# Patient Record
Sex: Female | Born: 1990 | Race: Black or African American | Hispanic: No | Marital: Single | Smoking: Current every day smoker
Health system: Southern US, Community
[De-identification: ages and names within clinical notes are randomized; demographics above are authoritative.]

## PROBLEM LIST (undated history)

## (undated) DIAGNOSIS — O8823 Thromboembolism in the puerperium: Secondary | ICD-10-CM

## (undated) HISTORY — PX: NO PAST SURGERIES: SHX2092

---
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
## 2010-10-30 LAB — URINALYSIS, ROUTINE W REFLEX MICROSCOPIC
Bilirubin Urine: NEGATIVE
Glucose, UA: NEGATIVE mg/dL
Hgb urine dipstick: NEGATIVE
Ketones, ur: NEGATIVE mg/dL
Protein, ur: NEGATIVE mg/dL
pH: 6 (ref 5.0–8.0)

## 2010-12-03 NOTE — Discharge Summary (Signed)
NAME:  Alicia Gutierrez, Alicia Gutierrez NO.:  [id number]   MEDICAL RECORD NO.:  [id number]          PATIENT TYPE:  INP   LOCATION:  9318                          FACILITY:  [facility]   PHYSICIAN:  Tracy L. Mayford Knife, M.D.DATE OF BIRTH:  1991-01-12   DATE OF ADMISSION:  12/21/2006  DATE OF DISCHARGE:  12/27/2006                               DISCHARGE SUMMARY   DISCHARGE DIAGNOSES:  1. Postpartum right adnexal phlegmon.  2. Right pelvic vein thrombophlebitis.  3. Normocytic anemia.  4. Status post normal spontaneous vaginal delivery Dec 18, 2006.   CONSULTATIONS:  Cliffton Asters, M.D., infectious disease.   LABORATORY DATA:  ANA negative.  Lupus anticoagulant negative.  Admission WBC 12.9.  Latest white blood cell count 11.6.  Platelets  219,000.  Blood cultures no growth to date.   IMAGING:  Chest x-ray within normal limits.  MRI of the pelvis initially  read as right gonadal vein thrombophlebitis.  Followup review by Dr.  Jena Gauss showed right parametrial/adnexal phlegmon.   HOSPITAL COURSE:  The patient is a 20 year old, status post  uncomplicated normal spontaneous vaginal delivery on May 30.  The  patient was discharged on June 1 but by late that afternoon she  developed a fever and severe right lower quadrant pain.  She presented  to the MAU and was readmitted on December 21, 2006.  CT scan showed the  expectant post delivery changes with right adnexal inflammation in a  thrombosis of the right gonadal vein.  The patient was started on  clindamycin, gentamicin and Lovenox.  Her pain improved rapidly but she  continued to spike fevers.  The MRI was reviewed again and this time  right parametrial phlegmon with no drainable abscess was noted.  Antibiotic therapy was broadened to Primaxin.  Infectious disease was  consulted and they agreed with the change in the antibiotics.   Please note that the patient was started on Lovenox on admission.  However, it was discontinued when she was  changed to Primaxin.  According to an up to date review, there does __________  anticoagulation in the patient with septic pelvic vein thrombophlebitis.  Expert recommendations were based on small retrospective studies rather  than large randomized controlled trials.  Based on that, Dr. Orvan Falconer  recommended just doing to Primaxin and if she improved, this might be  sufficient.   At discharge, the patient had been afebrile since 10 p.m. on June 6.   DISPOSITION:  Home.  Followup in one week at the GYN clinic.  Messages  left for Tresa Endo __________  to call the patient back with an appointment.   DISCHARGE MEDICATIONS:  1. Augmentin 500 one tablet p.o. b.i.d. for 10 days  2. Prenatal vitamins daily.   DISCHARGE INSTRUCTIONS:  1. The patient is to return earlier if she starts spiking temperatures      greater than 100.5 or has increased pain or vaginal bleeding.  2. Diet:  Regular.  3. Nothing per vagina for six weeks.  4. No heavy lifting.           ______________________________  Marc Morgans. Mayford Knife, M.D.  TLW/MEDQ  D:  12/27/2006  T:  12/28/2006  Job:  [id number]

## 2011-04-18 LAB — URINE CULTURE: Colony Count: 5000

## 2011-04-18 LAB — PREGNANCY, URINE: Preg Test, Ur: NEGATIVE

## 2011-04-18 LAB — URINE MICROSCOPIC-ADD ON

## 2011-04-18 LAB — URINALYSIS, ROUTINE W REFLEX MICROSCOPIC
Bilirubin Urine: NEGATIVE
Glucose, UA: NEGATIVE
Hgb urine dipstick: NEGATIVE
Ketones, ur: NEGATIVE
Nitrite: NEGATIVE
Protein, ur: NEGATIVE
Specific Gravity, Urine: 1.024
Urobilinogen, UA: 1
pH: 5.5

## 2011-04-18 LAB — WET PREP, GENITAL
Trich, Wet Prep: NONE SEEN
Yeast Wet Prep HPF POC: NONE SEEN

## 2011-04-18 LAB — GC/CHLAMYDIA PROBE AMP, GENITAL
Chlamydia, DNA Probe: POSITIVE — AB
GC Probe Amp, Genital: POSITIVE — AB

## 2011-04-25 LAB — WET PREP, GENITAL
Trich, Wet Prep: NONE SEEN
Yeast Wet Prep HPF POC: NONE SEEN

## 2011-04-25 LAB — GC/CHLAMYDIA PROBE AMP, GENITAL: GC Probe Amp, Genital: NEGATIVE

## 2011-04-25 LAB — URINALYSIS, ROUTINE W REFLEX MICROSCOPIC
Bilirubin Urine: NEGATIVE
Hgb urine dipstick: NEGATIVE
Protein, ur: NEGATIVE mg/dL
Urobilinogen, UA: 0.2 mg/dL (ref 0.0–1.0)

## 2011-05-08 LAB — LUPUS ANTICOAGULANT PANEL
DRVVT: 67.6 — ABNORMAL HIGH (ref 36.1–47.0)
Lupus Anticoagulant: NOT DETECTED
PTTLA 4:1 Mix: 71 — ABNORMAL HIGH (ref 36.3–48.8)

## 2011-05-08 LAB — BASIC METABOLIC PANEL
Calcium: 8.6
Chloride: 108
Creatinine, Ser: 0.66
Potassium: 3.4 — ABNORMAL LOW
Sodium: 137

## 2011-05-08 LAB — CBC
HCT: 32.5 — ABNORMAL LOW
HCT: 33
Hemoglobin: 10.7 — ABNORMAL LOW
Hemoglobin: 10.9 — ABNORMAL LOW
MCHC: 33.7
MCHC: 33.9
MCV: 83.5
MCV: 83.5
MCV: 84.3
MCV: 85.7
Platelets: 219
Platelets: 239
RBC: 3.8
RBC: 3.83
RDW: 13.5
RDW: 13.8 — ABNORMAL HIGH
RDW: 14.2 — ABNORMAL HIGH
WBC: 10.2
WBC: 11.6
WBC: 15.2 — ABNORMAL HIGH

## 2011-05-08 LAB — URINALYSIS, ROUTINE W REFLEX MICROSCOPIC
Bilirubin Urine: NEGATIVE
Hgb urine dipstick: NEGATIVE
Ketones, ur: NEGATIVE
Protein, ur: NEGATIVE
Urobilinogen, UA: 2 — ABNORMAL HIGH

## 2011-05-08 LAB — APTT
aPTT: 31
aPTT: 35

## 2011-05-08 LAB — GENTAMICIN LEVEL, TROUGH: Gentamicin Trough: 0.9

## 2011-05-08 LAB — CULTURE, BLOOD (ROUTINE X 2)

## 2011-05-08 LAB — DIFFERENTIAL
Basophils Absolute: 0
Eosinophils Relative: 1
Lymphocytes Relative: 10 — ABNORMAL LOW
Lymphocytes Relative: 8 — ABNORMAL LOW
Lymphs Abs: 1.1 — ABNORMAL LOW
Monocytes Absolute: 1.1
Monocytes Absolute: 2 — ABNORMAL HIGH
Monocytes Relative: 16 — ABNORMAL HIGH
Neutro Abs: 10.6 — ABNORMAL HIGH
Neutro Abs: 9.1 — ABNORMAL HIGH

## 2011-05-08 LAB — GRAM STAIN

## 2011-05-08 LAB — URINE CULTURE
Colony Count: NO GROWTH
Special Requests: NEGATIVE

## 2011-05-08 LAB — ANA: Anti Nuclear Antibody(ANA): NEGATIVE

## 2011-05-08 LAB — GENTAMICIN LEVEL, PEAK: Gentamicin Pk: 5.7

## 2012-04-15 ENCOUNTER — Emergency Department (HOSPITAL_COMMUNITY)
Admission: EM | Admit: 2012-04-15 | Discharge: 2012-04-15 | Disposition: A | Discharge status: Home / Self Care | Payer: Medicaid Other | Attending: Emergency Medicine | Admitting: Emergency Medicine

## 2012-04-15 ENCOUNTER — Encounter (HOSPITAL_COMMUNITY): Payer: Self-pay | Admitting: Emergency Medicine

## 2012-04-15 DIAGNOSIS — H00019 Hordeolum externum unspecified eye, unspecified eyelid: Secondary | ICD-10-CM | POA: Insufficient documentation

## 2012-04-15 DIAGNOSIS — H00011 Hordeolum externum right upper eyelid: Secondary | ICD-10-CM

## 2012-04-15 DIAGNOSIS — F17200 Nicotine dependence, unspecified, uncomplicated: Secondary | ICD-10-CM | POA: Insufficient documentation

## 2012-04-15 MED ORDER — CEPHALEXIN 500 MG PO CAPS: 500.0000 mg | ORAL_CAPSULE | Freq: Three times a day (TID) | ORAL | Status: DC

## 2012-04-15 MED ORDER — OXYCODONE-ACETAMINOPHEN 5-325 MG PO TABS: 2.0000 | ORAL_TABLET | Freq: Once | ORAL | Status: DC

## 2012-04-15 NOTE — ED Notes (Signed)
Pt presenting to ed with c/o right eye with swelling and pain onset this morning pt denies any drainage to her eye. Pt denies blurred vision.

## 2012-04-15 NOTE — ED Provider Notes (Signed)
History     CSN: [id number]  Arrival date & time 04/15/12  4098   First MD Initiated Contact with Patient 04/15/12 [phone]      Chief Complaint  Patient presents with  . Eye Pain    (Consider location/radiation/quality/duration/timing/severity/associated sxs/prior treatment) HPI Comments: Alicia Gutierrez 21 y.o. female   The chief complaint is: Patient presents with:   Eye Pain   The patient has medical history significant for:   History reviewed. No pertinent past medical history.  Patient presents with right eye swelling that began this morning. She states that it is mildly uncomfortable. Patient also reports more sneezing recently and she may be developing allergies. Of note patient has false eyelashes that she has been wearing for 2 weeks, but does not think that this is contributing to her current chief complaint. Denies fever or chills. Denies visual disturbance. Denies eye trauma. Denies use of contact lenses.       The history is provided by the patient. No language interpreter was used.    History reviewed. No pertinent past medical history.  History reviewed. No pertinent past surgical history.  No family history on file.  History  Substance Use Topics  . Smoking status: Current Every Day Smoker    Types: Cigarettes  . Smokeless tobacco: Not on file  . Alcohol Use: No    OB History    Grav Para Term Preterm Abortions TAB SAB Ect Mult Living                  Review of Systems  Constitutional: Negative for fever and chills.  HENT: Positive for sneezing.   Eyes: Positive for pain. Negative for discharge, redness, itching and visual disturbance.  All other systems reviewed and are negative.    Allergies  Review of patient's allergies indicates no known allergies.  Home Medications  No current outpatient prescriptions on file.  BP 130/76  Pulse 82  Temp 98.8 F (37.1 C) (Oral)  Resp 20  SpO2 99%  LMP 03/20/2012  Physical Exam  Nursing  note and vitals reviewed. Constitutional: She appears well-developed and well-nourished. No distress.  HENT:  Head: Normocephalic and atraumatic.  Mouth/Throat: Oropharynx is clear and moist.  Eyes: Conjunctivae normal and EOM are normal. Pupils are equal, round, and reactive to light. No scleral icterus.       Patient has mild right upper eyelid swelling with one area midline that seem slightly more pronounce, consistent with a hordeolum.  Neck: Normal range of motion. Neck supple.  Cardiovascular: Normal rate, regular rhythm and normal heart sounds.   Pulmonary/Chest: Effort normal and breath sounds normal.  Abdominal: Soft. Bowel sounds are normal. There is no tenderness.  Musculoskeletal: Normal range of motion.  Neurological: She is alert.  Skin: Skin is warm and dry.    ED Course  Procedures (including critical care time)  Labs Reviewed - No data to display No results found.   1. Hordeolum externum of right upper eyelid       MDM  Patient presented with acute right upper eyelid swelling she noticed this morning. Physical exam consistent with hordeolum. Patient instructed to remove false eye lashes, use warm compresses, and discharged on keflex with return precautions. No red flags for foreign body, corneal abrasion, or periorbital cellulitis.        Pixie Casino, PA-C 04/15/12 1018

## 2012-04-15 NOTE — ED Notes (Signed)
Pt c/o right eye pain, redness, and eyelid swelling since yesterday.  Pt denies known injury or foreign object.  Pt does not wear contacts.  Pt with false eyelashes which were applied 2 weeks ago, pt states she has worn false eyelashes for several years.

## 2012-04-15 NOTE — ED Provider Notes (Signed)
Medical screening examination/treatment/procedure(s) were performed by non-physician practitioner and as supervising physician I was immediately available for consultation/collaboration.   Jeremaih Klima M Arnelle Nale, MD 04/15/12 1521 

## 2012-10-14 ENCOUNTER — Encounter (HOSPITAL_COMMUNITY): Payer: Self-pay | Admitting: *Deleted

## 2012-10-14 ENCOUNTER — Inpatient Hospital Stay (HOSPITAL_COMMUNITY)
Admission: AD | Admit: 2012-10-14 | Discharge: 2012-10-14 | Disposition: A | Discharge status: Home / Self Care | Payer: Self-pay | Source: Ambulatory Visit | Attending: Obstetrics and Gynecology | Admitting: Obstetrics and Gynecology

## 2012-10-14 DIAGNOSIS — N938 Other specified abnormal uterine and vaginal bleeding: Secondary | ICD-10-CM | POA: Insufficient documentation

## 2012-10-14 DIAGNOSIS — N898 Other specified noninflammatory disorders of vagina: Secondary | ICD-10-CM

## 2012-10-14 DIAGNOSIS — N939 Abnormal uterine and vaginal bleeding, unspecified: Secondary | ICD-10-CM

## 2012-10-14 DIAGNOSIS — R109 Unspecified abdominal pain: Secondary | ICD-10-CM | POA: Insufficient documentation

## 2012-10-14 DIAGNOSIS — N949 Unspecified condition associated with female genital organs and menstrual cycle: Secondary | ICD-10-CM | POA: Insufficient documentation

## 2012-10-14 DIAGNOSIS — B9689 Other specified bacterial agents as the cause of diseases classified elsewhere: Secondary | ICD-10-CM | POA: Insufficient documentation

## 2012-10-14 DIAGNOSIS — N760 Acute vaginitis: Secondary | ICD-10-CM | POA: Insufficient documentation

## 2012-10-14 DIAGNOSIS — A499 Bacterial infection, unspecified: Secondary | ICD-10-CM | POA: Insufficient documentation

## 2012-10-14 HISTORY — DX: Thromboembolism in the puerperium: O88.23

## 2012-10-14 LAB — WET PREP, GENITAL
Trich, Wet Prep: NONE SEEN
Yeast Wet Prep HPF POC: NONE SEEN

## 2012-10-14 LAB — URINE MICROSCOPIC-ADD ON

## 2012-10-14 LAB — CBC WITH DIFFERENTIAL/PLATELET
Basophils Relative: 0 % (ref 0–1)
Eosinophils Absolute: 0.1 10*3/uL (ref 0.0–0.7)
Hemoglobin: 13.5 g/dL (ref 12.0–15.0)
MCH: 29.2 pg (ref 26.0–34.0)
MCHC: 33.8 g/dL (ref 30.0–36.0)
Monocytes Absolute: 0.4 10*3/uL (ref 0.1–1.0)
Monocytes Relative: 7 % (ref 3–12)
Neutrophils Relative %: 52 % (ref 43–77)

## 2012-10-14 LAB — URINALYSIS, ROUTINE W REFLEX MICROSCOPIC
Bilirubin Urine: NEGATIVE
Ketones, ur: NEGATIVE mg/dL
Nitrite: NEGATIVE
Urobilinogen, UA: 0.2 mg/dL (ref 0.0–1.0)

## 2012-10-14 MED ORDER — METRONIDAZOLE 500 MG PO TABS: 500.0000 mg | ORAL_TABLET | Freq: Two times a day (BID) | ORAL | Status: DC

## 2012-10-14 NOTE — MAU Note (Signed)
Patient states she has an Implanon in for 6 years. States she started bleeding 3-13 and has bled every day since, some heavy some light. Today spotting. Has been having abdominal pain for about one week. States 3-13 period was late.

## 2012-10-14 NOTE — MAU Provider Note (Signed)
History     CSN: [id number]  Arrival date and time: 10/14/12 1140   First Provider Initiated Contact with Patient 10/14/12 1223      Chief Complaint  Patient presents with  . Vaginal Bleeding  . Abdominal Pain   HPI Pt is a G2P2 who presents for prolonged menstrual bleeding. Onset was 2 weeks ago. For the first 4 days she bled very heavily, more so than a normal period, soaking through her clothes. Since then she has been bleeding as she does during a normal period, about 5 pads a day, and it does not seem to be decreasing. Associated symptoms include lower abdominal cramping that feels like menstrual cramps. She is sexually active, last time about 2 weeks ago. STD history includes trichomonas. She has not experienced any other bleeding such as gums or nose; no easy bruising. She has no history of thyroid disease, nor do any family members. She has not experienced any abdominal or vaginal trauma. No recent illnesses. No dysuria, hematuria. No fever/chills. No nausea/vomiting. Bowel habits normal.   Contraception: Implanon implanted about 3-4 years Menarche: age 21 Period frequency: once per month every three months and then skip month while with Implanon Duration: 5-7 days Amount: 5 pads per day when normal  Past Medical History  Diagnosis Date  . Obstetrical blood-clot embolism, with postpartum complication 2008    blood clot in pelvis after delivery    Past Surgical History  Procedure Laterality Date  . No past surgeries      Family History  Problem Relation Age of Onset  . Hypertension Mother   . Hypertension Maternal Grandmother     History  Substance Use Topics  . Smoking status: Current Every Day Smoker    Types: Cigarettes  . Smokeless tobacco: Not on file  . Alcohol Use: No    Allergies: No Known Allergies  Prescriptions prior to admission  Medication Sig Dispense Refill  . cephALEXin (KEFLEX) 500 MG capsule Take 1 capsule (500 mg total) by mouth 3 (three)  times daily.  15 capsule  0    ROS Pertinent positive and negatives discussed in HPI.  Physical Exam   Blood pressure 122/72, pulse 78, temperature 99 F (37.2 C), temperature source Oral, resp. rate 16, height 5\' 5"  (1.651 m), weight 92.987 kg (205 lb), last menstrual period 09/30/2012, SpO2 100.00%.  Physical Exam  Constitutional: She appears well-developed and well-nourished. No distress.  Cardiovascular: Normal rate and regular rhythm.   Respiratory: Effort normal and breath sounds normal.  GI: Soft. Bowel sounds are normal. She exhibits no distension. There is tenderness (mild, RLQ). There is no rebound and no guarding.  Genitourinary: Cervix exhibits no motion tenderness, no discharge and no friability. Right adnexum displays no mass, no tenderness and no fullness. Left adnexum displays no mass, no tenderness and no fullness. No erythema, tenderness or bleeding around the vagina. No foreign body around the vagina. No signs of injury around the vagina. No vaginal discharge found.  Small amount of blood on cervix  Skin: Skin is warm and dry.  Psychiatric: She has a normal mood and affect. Her behavior is normal.   Results for orders placed during the hospital encounter of 10/14/12 (from the past 24 hour(s))  URINALYSIS, ROUTINE W REFLEX MICROSCOPIC     Status: Abnormal   Collection Time    10/14/12 12:05 PM      Result Value Range   Color, Urine YELLOW  YELLOW   APPearance CLEAR  CLEAR  Specific Gravity, Urine 1.015  1.005 - 1.030   pH 6.0  5.0 - 8.0   Glucose, UA NEGATIVE  NEGATIVE mg/dL   Hgb urine dipstick SMALL (*) NEGATIVE   Bilirubin Urine NEGATIVE  NEGATIVE   Ketones, ur NEGATIVE  NEGATIVE mg/dL   Protein, ur NEGATIVE  NEGATIVE mg/dL   Urobilinogen, UA 0.2  0.0 - 1.0 mg/dL   Nitrite NEGATIVE  NEGATIVE   Leukocytes, UA TRACE (*) NEGATIVE  URINE MICROSCOPIC-ADD ON     Status: None   Collection Time    10/14/12 12:05 PM      Result Value Range   Squamous Epithelial /  LPF RARE  RARE   WBC, UA 3-6  <3 WBC/hpf   RBC / HPF 0-2  <3 RBC/hpf   Bacteria, UA RARE  RARE  POCT PREGNANCY, URINE     Status: None   Collection Time    10/14/12 12:14 PM      Result Value Range   Preg Test, Ur NEGATIVE  NEGATIVE  WET PREP, GENITAL     Status: Abnormal   Collection Time    10/14/12  1:50 PM      Result Value Range   Yeast Wet Prep HPF POC NONE SEEN  NONE SEEN   Trich, Wet Prep NONE SEEN  NONE SEEN   Clue Cells Wet Prep HPF POC FEW (*) NONE SEEN   WBC, Wet Prep HPF POC FEW (*) NONE SEEN  CBC WITH DIFFERENTIAL     Status: None   Collection Time    10/14/12  2:00 PM      Result Value Range   WBC 6.4  4.0 - 10.5 K/uL   RBC 4.62  3.87 - 5.11 MIL/uL   Hemoglobin 13.5  12.0 - 15.0 g/dL   HCT 16.1  09.6 - 04.5 %   MCV 86.4  78.0 - 100.0 fL   MCH 29.2  26.0 - 34.0 pg   MCHC 33.8  30.0 - 36.0 g/dL   RDW 40.9  81.1 - 91.4 %   Platelets 182  150 - 400 K/uL   Neutrophils Relative 52  43 - 77 %   Neutro Abs 3.3  1.7 - 7.7 K/uL   Lymphocytes Relative 40  12 - 46 %   Lymphs Abs 2.5  0.7 - 4.0 K/uL   Monocytes Relative 7  3 - 12 %   Monocytes Absolute 0.4  0.1 - 1.0 K/uL   Eosinophils Relative 1  0 - 5 %   Eosinophils Absolute 0.1  0.0 - 0.7 K/uL   Basophils Relative 0  0 - 1 %   Basophils Absolute 0.0  0.0 - 0.1 K/uL    MAU Course  Procedures  MDM Bleeding likely caused by expired Implanon implants. No vaginal discharge or odor, low suspicion for STD. Low suspicion for coagulation disorders due to lack of bleeding elsewhere or easy bruising.   Assessment and Plan  A:  Abnormal bleeding most likely caused by expired Implanon       Bacterial vaginosis  -P:  Patient instructed to have Have [facility], [facility]  -     CBC normal - hgb 13.5 -     UA normal -     GC/Chlamydia pending -     Wet prep - few clue cells. Rx. Metronidazole 500 mg PO BID x 7 days      Rx for flagyl to pharmacy--ETOH warning  Discharge home  Lorna Dibble 10/14/2012, 1:17 PM ]   I have have reviewed the patients HPI, observed his examination of the patient and reviewed labs and plan of care with the student.

## 2012-10-15 LAB — GC/CHLAMYDIA PROBE AMP: CT Probe RNA: NEGATIVE

## 2012-10-15 NOTE — MAU Provider Note (Signed)
Attestation of Attending Supervision of Advanced Practitioner (CNM/NP): Evaluation and management procedures were performed by the Advanced Practitioner under my supervision and collaboration.  I have reviewed the Advanced Practitioner's note and chart, and I agree with the management and plan.  Maddi Collar 10/15/2012 7:54 AM   

## 2013-03-20 ENCOUNTER — Encounter (HOSPITAL_COMMUNITY): Payer: Self-pay | Admitting: *Deleted

## 2013-03-20 ENCOUNTER — Emergency Department (HOSPITAL_COMMUNITY)
Admission: EM | Admit: 2013-03-20 | Discharge: 2013-03-20 | Disposition: A | Discharge status: Home / Self Care | Payer: Self-pay | Attending: Emergency Medicine | Admitting: Emergency Medicine

## 2013-03-20 DIAGNOSIS — R3915 Urgency of urination: Secondary | ICD-10-CM | POA: Insufficient documentation

## 2013-03-20 DIAGNOSIS — Z3202 Encounter for pregnancy test, result negative: Secondary | ICD-10-CM | POA: Insufficient documentation

## 2013-03-20 DIAGNOSIS — R6883 Chills (without fever): Secondary | ICD-10-CM | POA: Insufficient documentation

## 2013-03-20 DIAGNOSIS — F17200 Nicotine dependence, unspecified, uncomplicated: Secondary | ICD-10-CM | POA: Insufficient documentation

## 2013-03-20 DIAGNOSIS — M545 Low back pain, unspecified: Secondary | ICD-10-CM | POA: Insufficient documentation

## 2013-03-20 DIAGNOSIS — Z792 Long term (current) use of antibiotics: Secondary | ICD-10-CM | POA: Insufficient documentation

## 2013-03-20 DIAGNOSIS — M549 Dorsalgia, unspecified: Secondary | ICD-10-CM

## 2013-03-20 DIAGNOSIS — R6889 Other general symptoms and signs: Secondary | ICD-10-CM | POA: Insufficient documentation

## 2013-03-20 LAB — URINALYSIS, ROUTINE W REFLEX MICROSCOPIC
Bilirubin Urine: NEGATIVE
Hgb urine dipstick: NEGATIVE
Protein, ur: NEGATIVE mg/dL
Urobilinogen, UA: 0.2 mg/dL (ref 0.0–1.0)

## 2013-03-20 MED ORDER — IBUPROFEN 800 MG PO TABS
800.0000 mg | ORAL_TABLET | Freq: Once | ORAL | Status: AC
  Administered 2013-03-20: 800 mg via ORAL
  Filled 2013-03-20: qty 1

## 2013-03-20 MED ORDER — METHOCARBAMOL 500 MG PO TABS
500.0000 mg | ORAL_TABLET | Freq: Once | ORAL | Status: AC
  Administered 2013-03-20: 500 mg via ORAL
  Filled 2013-03-20: qty 1

## 2013-03-20 MED ORDER — IBUPROFEN 800 MG PO TABS: 800.0000 mg | ORAL_TABLET | Freq: Three times a day (TID) | ORAL | Status: DC

## 2013-03-20 MED ORDER — METHOCARBAMOL 500 MG PO TABS: 500.0000 mg | ORAL_TABLET | Freq: Two times a day (BID) | ORAL | Status: DC

## 2013-03-20 NOTE — ED Provider Notes (Signed)
Medical screening examination/treatment/procedure(s) were performed by non-physician practitioner and as supervising physician I was immediately available for consultation/collaboration.  Elohim Brune L Avary Pitsenbarger, MD 03/20/13 2348 

## 2013-03-20 NOTE — ED Notes (Signed)
Pt states she has right side back pain started at 4 pm today and it is 8/10,  Pt is alert and oriented

## 2013-03-20 NOTE — ED Provider Notes (Signed)
CSN: [id number]     Arrival date & time 03/20/13  1954 History   This chart was scribed for non-physician practitioner, Magnus Sinning, PA-C,working with Flint Melter, MD, by Karle Plumber, ED Scribe.  This patient was seen in room WTR7/WTR7 and the patient's care was started at 9:12 PM.    Chief Complaint  Patient presents with  . Back Pain   The history is provided by the patient. No language interpreter was used.   HPI Comments:  Alicia Gutierrez is a 21 y.o. female who presents to the Emergency Department complaining of sudden onset, sharp, constant right-sided lower back pain for the past 5 hours. Pt reports the pain is worsened with movement. She denies taking anything for the pain.   Pt denies any any recent injury or strenuous activity. Pt denies h/o UTIs. She states there is no possibility of pregnancy because she is not sexually active. She denies numbness, fever, tingling, loss of bowel or bladder function, dysuria, and polyuria. She reports some instances of urinary urgency. Pt reports currently smoking everyday and denies alcohol use.  Past Medical History  Diagnosis Date  . Obstetrical blood-clot embolism, with postpartum complication 2008    blood clot in pelvis after delivery   Past Surgical History  Procedure Laterality Date  . No past surgeries     Family History  Problem Relation Age of Onset  . Hypertension Mother   . Hypertension Maternal Grandmother    History  Substance Use Topics  . Smoking status: Current Every Day Smoker    Types: Cigarettes  . Smokeless tobacco: Not on file  . Alcohol Use: No   OB History   Grav Para Term Preterm Abortions TAB SAB Ect Mult Living   2 2 2       2      Review of Systems  Constitutional: Positive for chills. Negative for fever.  HENT: Positive for sneezing.   Gastrointestinal:       Denies bowel incontinence.  Genitourinary: Positive for urgency. Negative for dysuria and frequency.       Denies bladder  incontinence.  Musculoskeletal: Positive for back pain.  Neurological: Negative for numbness.    Allergies  Review of patient's allergies indicates no known allergies.  Home Medications   Current Outpatient Rx  Name  Route  Sig  Dispense  Refill  . metroNIDAZOLE (FLAGYL) 500 MG tablet   Oral   Take 1 tablet (500 mg total) by mouth 2 (two) times daily.   14 tablet   0    Triage Vitals: BP 116/69  Pulse 97  Temp(Src) 98.5 F (36.9 C) (Oral)  Resp 16  SpO2 100%  LMP 03/04/2013 Physical Exam  Nursing note and vitals reviewed. Constitutional: She is oriented to person, place, and time. She appears well-developed and well-nourished. No distress.  HENT:  Head: Normocephalic and atraumatic.  Mouth/Throat: Oropharynx is clear and moist.  Eyes: Conjunctivae and EOM are normal.  Neck: Normal range of motion. Neck supple.  Cardiovascular: Normal rate, regular rhythm, normal heart sounds and intact distal pulses.   Pulmonary/Chest: Effort normal and breath sounds normal. No respiratory distress. She has no wheezes. She has no rales.  Genitourinary:  No CVA tenderness.  Musculoskeletal: Normal range of motion. She exhibits tenderness (tenderness to palpation to right to mid lower back.). She exhibits no edema.  Tenderness to palpation to right to mid lower back. Pain worsens with flexion and rotation of L-spine. Normal gait.  Neurological: She is  alert and oriented to person, place, and time. She has normal reflexes. No sensory deficit.  2+ patellar reflexes. Distal sensation bilaterally intact.  Skin: Skin is warm and dry.  Psychiatric: She has a normal mood and affect. Her behavior is normal.    ED Course  Procedures (including critical care time) DIAGNOSTIC STUDIES: Oxygen Saturation is 100% on RA, normal by my interpretation.   COORDINATION OF CARE: 9:14 PM- Will give a dose of methocarbamol and ibuprofen and perform urinalysis to rule out UTI. Pt verbalizes understanding  and agrees to plan.  Medications  methocarbamol (ROBAXIN) tablet 500 mg (not administered)  ibuprofen (ADVIL,MOTRIN) tablet 800 mg (not administered)   Labs Review Labs Reviewed  URINALYSIS, ROUTINE W REFLEX MICROSCOPIC - Abnormal; Notable for the following:    Leukocytes, UA SMALL (*)    All other components within normal limits  URINE MICROSCOPIC-ADD ON - Abnormal; Notable for the following:    Squamous Epithelial / LPF FEW (*)    All other components within normal limits  POCT PREGNANCY, URINE   Imaging Review No results found.  10:14 PM Reassessed pain.  Patient reports that the Robaxin and Ibuprofen helped with her pain. MDM  No diagnosis found. Patient with back pain.  No neurological deficits and normal neuro exam.  Patient ambulating without difficulty.  No loss of bowel or bladder control.  No concern for cauda equina.  No fever, night sweats, weight loss, h/o cancer, IVDU.  RICE protocol and pain medicine indicated and discussed with patient.   I personally performed the services described in this documentation, which was scribed in my presence. The recorded information has been reviewed and is accurate.    Pascal Lux [place], PA-C 03/20/13 2236

## 2014-05-22 ENCOUNTER — Encounter (HOSPITAL_COMMUNITY): Payer: Self-pay | Admitting: *Deleted

## 2016-02-11 ENCOUNTER — Emergency Department (HOSPITAL_COMMUNITY)
Admission: EM | Admit: 2016-02-11 | Discharge: 2016-02-11 | Disposition: A | Discharge status: Home / Self Care | Payer: Medicaid Other | Attending: Dermatology | Admitting: Dermatology

## 2016-02-11 DIAGNOSIS — Z5321 Procedure and treatment not carried out due to patient leaving prior to being seen by health care provider: Secondary | ICD-10-CM | POA: Insufficient documentation

## 2016-02-11 DIAGNOSIS — M549 Dorsalgia, unspecified: Secondary | ICD-10-CM | POA: Diagnosis present

## 2016-02-11 DIAGNOSIS — Y9389 Activity, other specified: Secondary | ICD-10-CM | POA: Insufficient documentation

## 2016-02-11 DIAGNOSIS — F17210 Nicotine dependence, cigarettes, uncomplicated: Secondary | ICD-10-CM | POA: Insufficient documentation

## 2016-02-11 DIAGNOSIS — Y92410 Unspecified street and highway as the place of occurrence of the external cause: Secondary | ICD-10-CM | POA: Diagnosis not present

## 2016-02-11 DIAGNOSIS — Y999 Unspecified external cause status: Secondary | ICD-10-CM | POA: Insufficient documentation

## 2016-02-11 DIAGNOSIS — G43909 Migraine, unspecified, not intractable, without status migrainosus: Secondary | ICD-10-CM | POA: Diagnosis not present
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
## 2016-02-11 NOTE — ED Triage Notes (Signed)
Pt left before being triaged. Left labels at front desk.

## 2016-03-11 ENCOUNTER — Emergency Department (HOSPITAL_COMMUNITY)
Admission: EM | Admit: 2016-03-11 | Discharge: 2016-03-11 | Disposition: A | Discharge status: Home / Self Care | Payer: Self-pay | Attending: Emergency Medicine | Admitting: Emergency Medicine

## 2016-03-11 ENCOUNTER — Encounter (HOSPITAL_COMMUNITY): Payer: Self-pay | Admitting: Emergency Medicine

## 2016-03-11 ENCOUNTER — Emergency Department (HOSPITAL_COMMUNITY): Payer: Self-pay

## 2016-03-11 DIAGNOSIS — N898 Other specified noninflammatory disorders of vagina: Secondary | ICD-10-CM | POA: Insufficient documentation

## 2016-03-11 DIAGNOSIS — Z791 Long term (current) use of non-steroidal anti-inflammatories (NSAID): Secondary | ICD-10-CM | POA: Insufficient documentation

## 2016-03-11 DIAGNOSIS — F17210 Nicotine dependence, cigarettes, uncomplicated: Secondary | ICD-10-CM | POA: Insufficient documentation

## 2016-03-11 DIAGNOSIS — R197 Diarrhea, unspecified: Secondary | ICD-10-CM | POA: Insufficient documentation

## 2016-03-11 DIAGNOSIS — R112 Nausea with vomiting, unspecified: Secondary | ICD-10-CM | POA: Insufficient documentation

## 2016-03-11 DIAGNOSIS — R1030 Lower abdominal pain, unspecified: Secondary | ICD-10-CM | POA: Insufficient documentation

## 2016-03-11 LAB — BASIC METABOLIC PANEL
Anion gap: 7 (ref 5–15)
BUN: 8 mg/dL (ref 6–20)
CO2: 24 mmol/L (ref 22–32)
Calcium: 9 mg/dL (ref 8.9–10.3)
Chloride: 105 mmol/L (ref 101–111)
Creatinine, Ser: 0.83 mg/dL (ref 0.44–1.00)
GFR calc Af Amer: >60 mL/min (ref >60)
GLUCOSE: 91 mg/dL (ref 65–99)
POTASSIUM: 3.9 mmol/L (ref 3.5–5.1)
Sodium: 136 mmol/L (ref 135–145)

## 2016-03-11 LAB — WET PREP, GENITAL
CLUE CELLS WET PREP: NONE SEEN
SPERM: NONE SEEN
Trich, Wet Prep: NONE SEEN
Yeast Wet Prep HPF POC: NONE SEEN

## 2016-03-11 LAB — URINALYSIS, ROUTINE W REFLEX MICROSCOPIC
Bilirubin Urine: NEGATIVE
GLUCOSE, UA: NEGATIVE mg/dL
HGB URINE DIPSTICK: NEGATIVE
KETONES UR: NEGATIVE mg/dL
Leukocytes, UA: NEGATIVE
Nitrite: NEGATIVE
PH: 6.5 (ref 5.0–8.0)
PROTEIN: NEGATIVE mg/dL
Specific Gravity, Urine: 1.016 (ref 1.005–1.030)

## 2016-03-11 LAB — CBC
HCT: 39.1 % (ref 36.0–46.0)
Hemoglobin: 13 g/dL (ref 12.0–15.0)
MCH: 29.2 pg (ref 26.0–34.0)
MCHC: 33.2 g/dL (ref 30.0–36.0)
MCV: 87.9 fL (ref 78.0–100.0)
PLATELETS: 166 10*3/uL (ref 150–400)
RBC: 4.45 MIL/uL (ref 3.87–5.11)
RDW: 12.1 % (ref 11.5–15.5)
WBC: 8.7 10*3/uL (ref 4.0–10.5)

## 2016-03-11 LAB — HIV ANTIBODY (ROUTINE TESTING W REFLEX): HIV SCREEN 4TH GENERATION: NONREACTIVE

## 2016-03-11 LAB — RPR: RPR: NONREACTIVE

## 2016-03-11 LAB — PREGNANCY, URINE: Preg Test, Ur: NEGATIVE

## 2016-03-11 MED ORDER — IOPAMIDOL (ISOVUE-300) INJECTION 61%
100.0000 mL | Freq: Once | INTRAVENOUS | Status: AC | PRN
  Administered 2016-03-11: 100 mL via INTRAVENOUS

## 2016-03-11 MED ORDER — AZITHROMYCIN 250 MG PO TABS
1000.0000 mg | ORAL_TABLET | Freq: Once | ORAL | Status: AC
  Administered 2016-03-11: 1000 mg via ORAL
  Filled 2016-03-11: qty 4

## 2016-03-11 MED ORDER — ONDANSETRON HCL 4 MG/2ML IJ SOLN
4.0000 mg | Freq: Once | INTRAMUSCULAR | Status: DC
  Filled 2016-03-11: qty 2

## 2016-03-11 MED ORDER — ONDANSETRON 8 MG PO TBDP: 8.0000 mg | ORAL_TABLET | Freq: Three times a day (TID) | ORAL | 0 refills | Status: DC | PRN

## 2016-03-11 MED ORDER — STERILE WATER FOR INJECTION IJ SOLN
INTRAMUSCULAR | Status: AC
  Administered 2016-03-11: 10 mL
  Filled 2016-03-11: qty 10

## 2016-03-11 MED ORDER — CEFTRIAXONE SODIUM 250 MG IJ SOLR
250.0000 mg | Freq: Once | INTRAMUSCULAR | Status: AC
  Administered 2016-03-11: 250 mg via INTRAMUSCULAR
  Filled 2016-03-11: qty 250

## 2016-03-11 MED ORDER — FENTANYL CITRATE (PF) 100 MCG/2ML IJ SOLN
50.0000 ug | Freq: Once | INTRAMUSCULAR | Status: DC
  Filled 2016-03-11: qty 2

## 2016-03-11 MED ORDER — ONDANSETRON 4 MG PO TBDP
4.0000 mg | ORAL_TABLET | Freq: Once | ORAL | Status: AC
  Administered 2016-03-11: 4 mg via ORAL
  Filled 2016-03-11: qty 1

## 2016-03-11 MED ORDER — OXYCODONE-ACETAMINOPHEN 5-325 MG PO TABS
1.0000 | ORAL_TABLET | Freq: Once | ORAL | Status: AC
  Administered 2016-03-11: 1 via ORAL
  Filled 2016-03-11: qty 1

## 2016-03-11 NOTE — ED Triage Notes (Signed)
Pt c/o lower abd pain x 2 days, pain with coughing and sitting down. Pt c/o HA and runny nose as well. Pt c/o nausea and diarrhea.

## 2016-03-11 NOTE — ED Provider Notes (Signed)
[facility]-EMERGENCY DEPT Provider Note   CSN: [id number] Arrival date & time: 03/11/16  2952     History   Chief Complaint Chief Complaint  Patient presents with  . Migraine  . Nasal Congestion  . Abdominal Pain    HPI Alicia EIFERT is a 25 y.o. female.  The history is provided by the patient.  Migraine  Associated symptoms include abdominal pain. Pertinent negatives include no chest pain and no shortness of breath.  Abdominal Pain   Associated symptoms include diarrhea and nausea.  Patient presents with lower abdominal pain. She had for last few days. It is dull. Worse with movements. No dysuria. Worse with coughing. Has had some nausea and some diarrhea. She's had some clear vaginal discharge. Last menses was around 2 weeks ago and was normal. She's had some nasal congestion. She has a throbbing headache. Pain is improved with sitting but still present.  Past Medical History:  Diagnosis Date  . Obstetrical blood-clot embolism, with postpartum complication 2008   blood clot in pelvis after delivery    There are no active problems to display for this patient.   Past Surgical History:  Procedure Laterality Date  . NO PAST SURGERIES      OB History    Gravida Para Term Preterm AB Living   3 2 2     2    SAB TAB Ectopic Multiple Live Births                   Home Medications    Prior to Admission medications   Medication Sig Start Date End Date Taking? Authorizing Provider  Diphenhydramine-PE-APAP [facility] SEVERE COLD/CGH NIGHT) 25-10-650 MG PACK Take 1 Package by mouth at bedtime as needed (headache).   Yes Historical Provider, MD  DM-Phenylephrine-Acetaminophen 10-5-325 MG/15ML LIQD Take 30 mLs by mouth daily as needed (headache).    Yes Historical Provider, MD  naproxen sodium (ANAPROX) 220 MG tablet Take 440 mg by mouth daily as needed (headache).   Yes Historical Provider, MD  ibuprofen (ADVIL,MOTRIN) 800 MG tablet Take 1 tablet (800 mg total) by mouth 3  (three) times daily. Patient not taking: Reported on 03/11/2016 03/20/13   Santiago Glad, PA-C  methocarbamol (ROBAXIN) 500 MG tablet Take 1 tablet (500 mg total) by mouth 2 (two) times daily. 03/20/13   Heather Laisure, PA-C  ondansetron (ZOFRAN-ODT) 8 MG disintegrating tablet Take 1 tablet (8 mg total) by mouth every 8 (eight) hours as needed for nausea or vomiting. 03/11/16   Benjiman Core, MD    Family History Family History  Problem Relation Age of Onset  . Hypertension Mother   . Hypertension Maternal Grandmother     Social History Social History  Substance Use Topics  . Smoking status: Current Every Day Smoker    Types: Cigarettes  . Smokeless tobacco: Not on file  . Alcohol use No     Allergies   Review of patient's allergies indicates no known allergies.   Review of Systems Review of Systems  Constitutional: Negative for appetite change.  Respiratory: Negative for shortness of breath.   Cardiovascular: Negative for chest pain.  Gastrointestinal: Positive for abdominal pain, diarrhea and nausea.  Endocrine: Negative for polyuria.  Genitourinary: Positive for vaginal discharge. Negative for flank pain.  Musculoskeletal: Negative for gait problem.  Skin: Negative for wound.  Neurological: Negative for speech difficulty and light-headedness.  Psychiatric/Behavioral: Negative for behavioral problems.     Physical Exam Updated Vital Signs BP 125/71 (BP Location: Left Arm)  Pulse 84   Temp 98.6 F (37 C) (Oral)   Resp 16   Ht 5\' 5"  (1.651 m)   Wt 194 lb (88 kg)   LMP 02/28/2016   SpO2 100%   Breastfeeding? Unknown Comment: negative urine pregnancy test 03-11-2016  BMI 32.28 kg/m   Physical Exam  Constitutional: She appears well-developed.  HENT:  Head: Atraumatic.  Eyes: EOM are normal.  Neck: Neck supple.  Cardiovascular: Normal rate.   Pulmonary/Chest: Effort normal.  Abdominal: Soft. There is tenderness.  Suprapubic/lower abdominal tenderness.  No mass.  Neurological: She is alert.  Skin: Skin is warm.  Mild white vaginal discharge with some pain with cervical movement. No adnexal tenderness.   ED Treatments / Results  Labs (all labs ordered are listed, but only abnormal results are displayed) Labs Reviewed  WET PREP, GENITAL - Abnormal; Notable for the following:       Result Value   WBC, Wet Prep HPF POC MANY (*)    All other components within normal limits  CBC  URINALYSIS, ROUTINE W REFLEX MICROSCOPIC (NOT AT [facility])  BASIC METABOLIC PANEL  PREGNANCY, URINE  RPR  HIV ANTIBODY (ROUTINE TESTING)  GC/CHLAMYDIA PROBE AMP ([place]) NOT AT [facility]    EKG  EKG Interpretation None       Radiology Ct Abdomen Pelvis W Contrast  Result Date: 03/11/2016 CLINICAL DATA:  Lower abdominal pain for 2 days.  Cough. EXAM: CT ABDOMEN AND PELVIS WITH CONTRAST TECHNIQUE: Multidetector CT imaging of the abdomen and pelvis was performed using the standard protocol following bolus administration of intravenous contrast. CONTRAST:  100mL ISOVUE-300 IOPAMIDOL (ISOVUE-300) INJECTION 61% COMPARISON:  12/21/2006 FINDINGS: Lower chest: Consolidation noted in the right lower lobe most compatible with pneumonia. Left lung bases clear. No effusions. Heart is normal size. Hepatobiliary: No focal hepatic abnormality. Gallbladder unremarkable. Pancreas: No focal abnormality or ductal dilatation. Spleen: No focal abnormality.  Normal size. Adrenals/Urinary Tract: No adrenal abnormality. No focal renal abnormality. No stones or hydronephrosis. Urinary bladder is unremarkable. Stomach/Bowel: Stomach, large and small bowel grossly unremarkable. Appendix not definitively visualized. No inflammatory process in the right lower quadrant. Vascular/Lymphatic: No evidence of aneurysm or adenopathy. Reproductive: Uterus and adnexa unremarkable.  No mass. Other: Trace free fluid in the pelvis.  No free air. Musculoskeletal: No acute bony abnormality or focal bone  lesion. IMPRESSION: Consolidation in the right lower lobe compatible with pneumonia. No acute findings in the abdomen or pelvis. Electronically Signed   By: Charlett NoseKevin  Dover M.D.   On: 03/11/2016 11:01    Procedures Procedures (including critical care time)  Medications Ordered in ED Medications  oxyCODONE-acetaminophen (PERCOCET/ROXICET) 5-325 MG per tablet 1 tablet (1 tablet Oral Given 03/11/16 0901)  ondansetron (ZOFRAN-ODT) disintegrating tablet 4 mg (4 mg Oral Given 03/11/16 0901)  iopamidol (ISOVUE-300) 61 % injection 100 mL (100 mLs Intravenous Contrast Given 03/11/16 1037)  cefTRIAXone (ROCEPHIN) injection 250 mg (250 mg Intramuscular Given 03/11/16 1303)  azithromycin (ZITHROMAX) tablet 1,000 mg (1,000 mg Oral Given 03/11/16 1305)  sterile water (preservative free) injection (10 mLs  Given 03/11/16 1304)     Initial Impression / Assessment and Plan / ED Course  I have reviewed the triage vital signs and the nursing notes.  Pertinent labs & imaging results that were available during my care of the patient were reviewed by me and considered in my medical decision making (see chart for details).  Clinical Course    Patient with lower abdominal pain. Labs overall reassuring. Continued pain and  CT scan done. Had occasional cough but pneumonia does not really fit clinically. Will give Rocephin and azithromycin to treat possible STDs. Will discharge home.  Final Clinical Impressions(s) / ED Diagnoses   Final diagnoses:  Lower abdominal pain  Nausea vomiting and diarrhea    New Prescriptions Discharge Medication List as of 03/11/2016 12:50 PM    START taking these medications   Details  ondansetron (ZOFRAN-ODT) 8 MG disintegrating tablet Take 1 tablet (8 mg total) by mouth every 8 (eight) hours as needed for nausea or vomiting., Starting Tue 03/11/2016, Print         Benjiman CoreNathan Callie Bunyard, MD 03/12/16 1022

## 2016-03-11 NOTE — ED Notes (Signed)
Patient states that she is having lower abdominal pain. Patient states that it has been going on for more than a week. Patient states she has not missed any menstrual periods. Urine requested for patient.

## 2016-03-12 LAB — GC/CHLAMYDIA PROBE AMP (~~LOC~~) NOT AT ARMC
CHLAMYDIA, DNA PROBE: NEGATIVE
NEISSERIA GONORRHEA: NEGATIVE

## 2020-01-19 ENCOUNTER — Other Ambulatory Visit: Payer: Self-pay

## 2020-01-19 ENCOUNTER — Emergency Department (HOSPITAL_COMMUNITY)
Admission: EM | Admit: 2020-01-19 | Discharge: 2020-01-19 | Disposition: A | Discharge status: Home / Self Care | Payer: Medicaid Other | Attending: Emergency Medicine | Admitting: Emergency Medicine

## 2020-01-19 DIAGNOSIS — J029 Acute pharyngitis, unspecified: Secondary | ICD-10-CM | POA: Insufficient documentation

## 2020-01-19 DIAGNOSIS — F17210 Nicotine dependence, cigarettes, uncomplicated: Secondary | ICD-10-CM | POA: Diagnosis not present

## 2020-01-19 LAB — GROUP A STREP BY PCR: Group A Strep by PCR: NOT DETECTED

## 2020-01-19 MED ORDER — DEXAMETHASONE 4 MG PO TABS
10.0000 mg | ORAL_TABLET | Freq: Once | ORAL | Status: AC
  Administered 2020-01-19: 10 mg via ORAL
  Filled 2020-01-19: qty 2

## 2020-01-19 MED ORDER — ACETAMINOPHEN 325 MG PO TABS
650.0000 mg | ORAL_TABLET | Freq: Once | ORAL | Status: AC | PRN
Start: 2020-01-19 — End: 2020-01-19
  Administered 2020-01-19: 650 mg via ORAL
  Filled 2020-01-19: qty 2

## 2020-01-19 MED ORDER — CEFTRIAXONE SODIUM 1 G IJ SOLR
500.0000 mg | Freq: Once | INTRAMUSCULAR | Status: AC
  Administered 2020-01-19: 500 mg via INTRAMUSCULAR
  Filled 2020-01-19: qty 10

## 2020-01-19 MED ORDER — STERILE WATER FOR INJECTION IJ SOLN
INTRAMUSCULAR | Status: AC
  Filled 2020-01-19: qty 10

## 2020-01-19 NOTE — ED Triage Notes (Signed)
29 yo pt presents c/o of sore throat x 2 days. Pt states she has been having difficulty swallowing but denies fever, runny nose cough or any other symptoms at this time

## 2020-01-19 NOTE — ED Provider Notes (Signed)
[facility]-EMERGENCY DEPT Provider Note   CSN: [id number] Arrival date & time: 01/19/20  2037     History Chief Complaint  Patient presents with  . Sore Throat    Alicia Gutierrez is a 29 y.o. female.  The history is provided by the patient. No language interpreter was used.  Sore Throat   Alicia Gutierrez is a 29 y.o. female who presents to the Emergency Department complaining of sore throat. She presents the emergency department complaining of sore throat for the last two days. She has pain with swallowing, no difficulty breathing. No known sick contacts. No cough, nausea, vomiting. No fevers at home. She has performed oral intercourse. Denies any medical problems. Takes no medications. Symptoms are moderate, constant, worsening.    Past Medical History:  Diagnosis Date  . Obstetrical blood-clot embolism, with postpartum complication 2008   blood clot in pelvis after delivery    There are no problems to display for this patient.   Past Surgical History:  Procedure Laterality Date  . NO PAST SURGERIES       OB History    Gravida  3   Para  2   Term  2   Preterm      AB      Living  2     SAB      TAB      Ectopic      Multiple      Live Births              Family History  Problem Relation Age of Onset  . Hypertension Mother   . Hypertension Maternal Grandmother     Social History   Tobacco Use  . Smoking status: Current Every Day Smoker    Types: Cigarettes  Substance Use Topics  . Alcohol use: No  . Drug use: No    Home Medications Prior to Admission medications   Medication Sig Start Date End Date Taking? Authorizing Provider  Diphenhydramine-PE-APAP [facility] SEVERE COLD/CGH NIGHT) 25-10-650 MG PACK Take 1 Package by mouth at bedtime as needed (headache).    [provider]  DM-Phenylephrine-Acetaminophen 10-5-325 MG/15ML LIQD Take 30 mLs by mouth daily as needed (headache).     [provider]  ibuprofen (ADVIL,MOTRIN) 800 MG tablet Take 1 tablet (800 mg total) by mouth 3 (three) times daily. Patient not taking: Reported on 03/11/2016 03/20/13   Santiago Glad, PA-C  methocarbamol (ROBAXIN) 500 MG tablet Take 1 tablet (500 mg total) by mouth 2 (two) times daily. 03/20/13   Santiago Glad, PA-C  naproxen sodium (ANAPROX) 220 MG tablet Take 440 mg by mouth daily as needed (headache).    [provider]  ondansetron (ZOFRAN-ODT) 8 MG disintegrating tablet Take 1 tablet (8 mg total) by mouth every 8 (eight) hours as needed for nausea or vomiting. 03/11/16   Benjiman Core, MD    Allergies    Patient has no known allergies.  Review of Systems   Review of Systems  All other systems reviewed and are negative.   Physical Exam Updated Vital Signs BP (!) 143/86 (BP Location: Right Arm)   Pulse (!) 117   Temp 100 F (37.8 C) (Oral)   Resp 16   Ht 5\' 5"  (1.651 m)   Wt 92.5 kg   LMP 12/29/2019 (Approximate)   SpO2 100%   BMI 33.95 kg/m   Physical Exam Vitals and nursing note reviewed.  Constitutional:      Appearance: She  is well-developed.  HENT:     Head: Normocephalic and atraumatic.     Comments: Moderate tonsilar erythema and edema bilaterally with significant tonsilar exudate bilaterally.  Cardiovascular:     Rate and Rhythm: Regular rhythm. Tachycardia present.     Heart sounds: No murmur heard.   Pulmonary:     Effort: Pulmonary effort is normal. No respiratory distress.     Breath sounds: Normal breath sounds. No stridor.  Abdominal:     Palpations: Abdomen is soft.     Tenderness: There is no abdominal tenderness. There is no guarding or rebound.  Musculoskeletal:        General: No tenderness.     Cervical back: Neck supple.  Skin:    General: Skin is warm and dry.  Neurological:     Mental Status: She is alert and oriented to person, place, and time.  Psychiatric:        Behavior: Behavior normal.     ED Results /  Procedures / Treatments   Labs (all labs ordered are listed, but only abnormal results are displayed) Labs Reviewed  GROUP A STREP BY PCR    EKG None  Radiology No results found.  Procedures Procedures (including critical care time)  Medications Ordered in ED Medications  cefTRIAXone (ROCEPHIN) injection 500 mg (has no administration in time range)  dexamethasone (DECADRON) tablet 10 mg (has no administration in time range)  acetaminophen (TYLENOL) tablet 650 mg (650 mg Oral Given 01/19/20 2053)    ED Course  I have reviewed the triage vital signs and the nursing notes.  Pertinent labs & imaging results that were available during my care of the patient were reviewed by me and considered in my medical decision making (see chart for details).    MDM Rules/Calculators/A&P                         Patient here for evaluation of sore throat for the last two days. She does have erythema and exudates on examination with no evidence of Peritonisilar abscess. She does report having oral intercourse. Discussed with patient possibility of gonoccocal pharyngitis. Offered STI testing and she declines. She is agreeable to empiric treatment. Discussed with patient home care for pharyngitis, unclear etiology. Discussed outpatient follow-up and return precautions.  Final Clinical Impression(s) / ED Diagnoses Final diagnoses:  Pharyngitis, unspecified etiology    Rx / DC Orders ED Discharge Orders    None       Tilden Fossa, MD 01/19/20 2142

## 2020-12-19 ENCOUNTER — Emergency Department (HOSPITAL_COMMUNITY)
Admission: EM | Admit: 2020-12-19 | Discharge: 2020-12-19 | Discharge status: Against Medical Advice | Payer: Medicaid Other | Attending: Physician Assistant | Admitting: Physician Assistant

## 2020-12-19 ENCOUNTER — Other Ambulatory Visit: Payer: Self-pay

## 2020-12-19 DIAGNOSIS — N898 Other specified noninflammatory disorders of vagina: Secondary | ICD-10-CM | POA: Insufficient documentation

## 2020-12-19 DIAGNOSIS — Z5321 Procedure and treatment not carried out due to patient leaving prior to being seen by health care provider: Secondary | ICD-10-CM | POA: Diagnosis not present

## 2020-12-19 DIAGNOSIS — R52 Pain, unspecified: Secondary | ICD-10-CM | POA: Insufficient documentation

## 2020-12-19 NOTE — ED Triage Notes (Signed)
Pt reports headaches and generalized body aches for 3 days. Pt also reports concern with abnormal vaginal discharge

## 2020-12-19 NOTE — ED Provider Notes (Signed)
Emergency Medicine Provider Triage Evaluation Note  Alicia Gutierrez , a 30 y.o. female  was evaluated in triage.  Pt complains of generalized body aches, headaches for the past 3 days.  Also concerned that she has abnormal discharge and bleeding and is concerned that there may be an abrasion near her vaginal area.  Reports some abdominal pain as well.  No vomiting, chest pain, shortness of breath.  Review of Systems  Positive: Vaginal discharge, headache, generalized body aches Negative: Chest pain, vomiting, shortness of breath  Physical Exam  BP (!) 144/79 (BP Location: Right Arm)   Pulse 72   Temp 98.3 F (36.8 C) (Oral)   Resp 16   Ht 5\' 5"  (1.651 m)   Wt 95.3 kg   LMP 11/23/2020 (Approximate)   SpO2 99%   BMI 34.95 kg/m  Gen:   Awake, no distress   Resp:  Normal effort  MSK:   Moves extremities without difficulty  Other:  Abdomen is soft  Medical Decision Making  Medically screening exam initiated at 6:35 PM.  Appropriate orders placed.  01/23/2021 was informed that the remainder of the evaluation will be completed by another provider, this initial triage assessment does not replace that evaluation, and the importance of remaining in the ED until their evaluation is complete.  Will need pelvic exam   Alicia Klinefelter, PA-C 12/19/20 1836    02/18/21, MD 12/21/20 [phone]

## 2020-12-19 NOTE — ED Notes (Signed)
Patient called in lobby for vital sign reassessment but no response

## 2020-12-20 ENCOUNTER — Other Ambulatory Visit: Payer: Self-pay

## 2020-12-20 ENCOUNTER — Encounter (HOSPITAL_COMMUNITY): Payer: Self-pay

## 2020-12-20 ENCOUNTER — Emergency Department (HOSPITAL_COMMUNITY)
Admission: EM | Admit: 2020-12-20 | Discharge: 2020-12-20 | Disposition: A | Discharge status: Home / Self Care | Payer: Medicaid Other | Attending: Emergency Medicine | Admitting: Emergency Medicine

## 2020-12-20 DIAGNOSIS — N898 Other specified noninflammatory disorders of vagina: Secondary | ICD-10-CM

## 2020-12-20 DIAGNOSIS — N938 Other specified abnormal uterine and vaginal bleeding: Secondary | ICD-10-CM | POA: Insufficient documentation

## 2020-12-20 DIAGNOSIS — D72829 Elevated white blood cell count, unspecified: Secondary | ICD-10-CM | POA: Insufficient documentation

## 2020-12-20 DIAGNOSIS — B9689 Other specified bacterial agents as the cause of diseases classified elsewhere: Secondary | ICD-10-CM | POA: Diagnosis not present

## 2020-12-20 DIAGNOSIS — N760 Acute vaginitis: Secondary | ICD-10-CM | POA: Diagnosis not present

## 2020-12-20 DIAGNOSIS — F17210 Nicotine dependence, cigarettes, uncomplicated: Secondary | ICD-10-CM | POA: Diagnosis not present

## 2020-12-20 DIAGNOSIS — R519 Headache, unspecified: Secondary | ICD-10-CM | POA: Insufficient documentation

## 2020-12-20 LAB — URINALYSIS, ROUTINE W REFLEX MICROSCOPIC
Bilirubin Urine: NEGATIVE
Glucose, UA: NEGATIVE mg/dL
Hgb urine dipstick: NEGATIVE
Ketones, ur: NEGATIVE mg/dL
Nitrite: NEGATIVE
Protein, ur: NEGATIVE mg/dL
Specific Gravity, Urine: 1.015 (ref 1.005–1.030)
pH: 5 (ref 5.0–8.0)

## 2020-12-20 LAB — HIV ANTIBODY (ROUTINE TESTING W REFLEX): HIV Screen 4th Generation wRfx: NONREACTIVE

## 2020-12-20 LAB — WET PREP, GENITAL
Sperm: NONE SEEN
Trich, Wet Prep: NONE SEEN
Yeast Wet Prep HPF POC: NONE SEEN

## 2020-12-20 LAB — I-STAT BETA HCG BLOOD, ED (MC, WL, AP ONLY): I-stat hCG, quantitative: 242.6 m[IU]/mL — ABNORMAL HIGH (ref <5)

## 2020-12-20 LAB — PREGNANCY, URINE: Preg Test, Ur: NEGATIVE

## 2020-12-20 MED ORDER — METRONIDAZOLE 500 MG PO TABS: 500.0000 mg | ORAL_TABLET | Freq: Two times a day (BID) | ORAL | 0 refills | Status: AC

## 2020-12-20 NOTE — ED Provider Notes (Signed)
[facility]-EMERGENCY DEPT Provider Note   CSN: [id number] Arrival date & time: 12/20/20  1541     History Chief Complaint  Patient presents with  . Headache  . Generalized Body Aches  . Vaginal Discharge    Alicia Gutierrez is a 30 y.o. female.  30 y.o female with a PMH of trichomonas presents to the ED with a chief complaint of body aches, vaginal discharge for the past 4 days.  Patient reports last sexual activity was unprotected over the weekend, she is endorsing some yellow, thin discharge that she is noted.  There is no foul odor to it.  He does report some urinary symptoms, feeling like she has urgency to urinate lately.  So endorses some lower abdominal cramping, is attributing this to her menstrual cycle coming on in the next few days.  She also endorses itching to the outside of the vagina especially after taking a shower. She denies any nausea, vomiting, or vaginal bleeding.   The history is provided by the patient.  Headache Associated symptoms: abdominal pain   Associated symptoms: no fever and no nausea   Vaginal Discharge Quality:  Yellow Severity:  Moderate Onset quality:  Sudden Duration:  4 days Timing:  Constant Progression:  Unchanged Chronicity:  New Context: after urination   Relieved by:  Nothing Associated symptoms: abdominal pain, dysuria and vaginal itching   Associated symptoms: no fever, no nausea, no rash and no urinary incontinence        Past Medical History:  Diagnosis Date  . Obstetrical blood-clot embolism, with postpartum complication 2008   blood clot in pelvis after delivery    There are no problems to display for this patient.   Past Surgical History:  Procedure Laterality Date  . NO PAST SURGERIES       OB History    Gravida  3   Para  2   Term  2   Preterm      AB      Living  2     SAB      IAB      Ectopic      Multiple      Live Births              Family History  Problem  Relation Age of Onset  . Hypertension Mother   . Hypertension Maternal Grandmother     Social History   Tobacco Use  . Smoking status: Current Every Day Smoker    Packs/day: 0.15    Types: Cigarettes  . Smokeless tobacco: Never Used  Vaping Use  . Vaping Use: Never used  Substance Use Topics  . Alcohol use: No  . Drug use: No    Home Medications Prior to Admission medications   Medication Sig Start Date End Date Taking? Authorizing Provider  metroNIDAZOLE (FLAGYL) 500 MG tablet Take 1 tablet (500 mg total) by mouth 2 (two) times daily for 7 days. 12/20/20 12/27/20 Yes Aaryav Hopfensperger, PA-C  Diphenhydramine-PE-APAP (THERAFLU SEVERE COLD/CGH NIGHT) 25-10-650 MG PACK Take 1 Package by mouth at bedtime as needed (headache).    [provider]  DM-Phenylephrine-Acetaminophen 10-5-325 MG/15ML LIQD Take 30 mLs by mouth daily as needed (headache).     [provider]  ibuprofen (ADVIL,MOTRIN) 800 MG tablet Take 1 tablet (800 mg total) by mouth 3 (three) times daily. Patient not taking: Reported on 03/11/2016 03/20/13   Santiago Glad, PA-C  methocarbamol (ROBAXIN) 500 MG tablet Take 1 tablet (500  mg total) by mouth 2 (two) times daily. 03/20/13   Santiago Glad, PA-C  naproxen sodium (ANAPROX) 220 MG tablet Take 440 mg by mouth daily as needed (headache).    [provider]  ondansetron (ZOFRAN-ODT) 8 MG disintegrating tablet Take 1 tablet (8 mg total) by mouth every 8 (eight) hours as needed for nausea or vomiting. 03/11/16   Benjiman Core, MD    Allergies    Patient has no known allergies.  Review of Systems   Review of Systems  Constitutional: Negative for fever.  Gastrointestinal: Positive for abdominal pain. Negative for nausea.  Genitourinary: Positive for dysuria and vaginal discharge. Negative for bladder incontinence.  Neurological: Positive for headaches.  All other systems reviewed and are negative.   Physical Exam Updated Vital Signs BP 120/65  (BP Location: Left Arm)   Pulse 78   Temp 98.4 F (36.9 C)   Resp 17   Ht 5\' 5"  (1.651 m)   Wt 95.3 kg   LMP 11/23/2020 (Approximate)   SpO2 96%   BMI 34.95 kg/m   Physical Exam Vitals and nursing note reviewed. Exam conducted with a chaperone present.  Constitutional:      Appearance: She is well-developed.  HENT:     Head: Normocephalic and atraumatic.  Pulmonary:     Effort: Pulmonary effort is normal.  Abdominal:     Palpations: Abdomen is soft.     Tenderness: There is abdominal tenderness in the suprapubic area.     Comments: Mild suprapubic tenderness with deep palpation.  Genitourinary:    Exam position: Supine.     Pubic Area: No rash.      Labia:        Right: No rash or tenderness.        Left: No rash or tenderness.      Vagina: Tenderness present.     Cervix: No erythema.     Uterus: Normal.      Comments: Chaperone  by 01/23/2021 RN.  Minimal clear discharge on vaginal vault, dryness noted all along but no erythema or strawberry cervix noted.  Musculoskeletal:     Cervical back: Normal range of motion and neck supple.  Skin:    General: Skin is warm and dry.  Neurological:     Mental Status: She is alert and oriented to person, place, and time.     GCS: GCS eye subscore is 4. GCS verbal subscore is 5. GCS motor subscore is 6.     ED Results / Procedures / Treatments   Labs (all labs ordered are listed, but only abnormal results are displayed) Labs Reviewed  WET PREP, GENITAL - Abnormal; Notable for the following components:      Result Value   Clue Cells Wet Prep HPF POC PRESENT (*)    WBC, Wet Prep HPF POC FEW (*)    All other components within normal limits  URINALYSIS, ROUTINE W REFLEX MICROSCOPIC - Abnormal; Notable for the following components:   Leukocytes,Ua TRACE (*)    Bacteria, UA RARE (*)    All other components within normal limits  I-STAT BETA HCG BLOOD, ED ([facility], [facility], [facility] ONLY) - Abnormal; Notable for the following components:   I-stat  hCG, quantitative 242.6 (*)    All other components within normal limits  PREGNANCY, URINE  RPR  HIV ANTIBODY (ROUTINE TESTING W REFLEX)  I-STAT BETA HCG BLOOD, ED ([facility], [facility], [facility] ONLY)  GC/CHLAMYDIA PROBE AMP ([place]) NOT AT [facility]    EKG None  Radiology No results found.  Procedures Procedures   Medications Ordered in ED Medications - No data to display  ED Course  I have reviewed the triage vital signs and the nursing notes.  Pertinent labs & imaging results that were available during my care of the patient were reviewed by me and considered in my medical decision making (see chart for details).  Clinical Course as of 12/20/20 1859  Thu Dec 20, 2020  1831 Clue Cells Wet Prep HPF POC(!): PRESENT [JS]  1831 WBC, Wet Prep HPF POC(!): FEW [JS]  1835 I-stat hCG, quantitative(!): 242.6 [JS]  1856 Preg Test, Ur: NEGATIVE [JS]  1858 Leukocytes,Ua(!): TRACE [JS]    Clinical Course User Index [JS] Claude Manges, PA-C   MDM Rules/Calculators/A&P    Patient presents to the ED with a chief complaint of vaginal discharge for the past 4 days along with body aches.  Last menstrual period was about a month ago.  Recent sexual encounter over the weekend with a new partner, unprotected.  Vaginal discharge is described as clear, occurs after showers, some pruritus also involved.  Prior history of trichomonas.  During evaluation patient is overall well-appearing, vitals are within normal limits, she is afebrile.  Minimal tenderness to palpation along the suprapubic region.  No CVA tenderness, does endorse some urinary dysuria.  Will perform pelvic exam, assisted by [place] RN.  No CMT, cervix without any erythema, friability.  Minimal vaginal discharge noted. Urinalysis with trace of leukocytes, no white blood cell count, rare bacteria and no squamous present.  Beta hCG was 242.6, however her urine pregnancy is negative on today's visit.  Her wet prep is positive for clue cells, we discussed  likely bacterial vaginosis causing her discomfort.  She will be provided with a prescription for antibiotics.  I have also discussed with her recheck on beta hCG at the [facility], he is agreeable of this at this time.  The rest of her work-up is unremarkable.  Patient stable for discharge.  Return precautions discussed at length.    Portions of this note were generated with Scientist, clinical (histocompatibility and immunogenetics). Dictation errors may occur despite best attempts at proofreading.  Final Clinical Impression(s) / ED Diagnoses Final diagnoses:  Vaginal discharge  BV (bacterial vaginosis)    Rx / DC Orders ED Discharge Orders         Ordered    metroNIDAZOLE (FLAGYL) 500 MG tablet  2 times daily        12/20/20 1859           Claude Manges, PA-C 12/20/20 1859    Mancel Bale, MD 12/22/20 1051

## 2020-12-20 NOTE — ED Triage Notes (Signed)
Patient c/o headache, body aches, and a vaginal discharge x 4 days. paitent states she was in the ED yesterday for the same, but had to leave to get her children.

## 2020-12-20 NOTE — Discharge Instructions (Addendum)
I have prescribed antibiotics to help treat your bacterial vaginosis, please take 1 tablet twice a day for the next 7 days.  We discussed needing to get her blood work redone for your beta hCG in about 7 days.  Your urine pregnancy today was negative.

## 2020-12-20 NOTE — ED Provider Notes (Signed)
Emergency Medicine Provider Triage Evaluation Note  Alicia Gutierrez , a 30 y.o. female  was evaluated in triage.  Pt complains of vaginal discharge.  Review of Systems  Positive: Headache, bodyaches, vaginal discharge Negative: Fever, cp, sob  Physical Exam  BP 129/82 (BP Location: Left Arm)   Pulse 94   Temp 98.4 F (36.9 C)   Resp 16   Ht 5\' 5"  (1.651 m)   Wt 95.3 kg   LMP 11/23/2020 (Approximate)   SpO2 100%   BMI 34.95 kg/m  Gen:   Awake, no distress   Resp:  Normal effort  MSK:   Moves extremities without difficulty  Other:  Abdomen nontender to palpation  Medical Decision Making  Medically screening exam initiated at 4:02 PM.  Appropriate orders placed.  01/23/2021 was informed that the remainder of the evaluation will be completed by another provider, this initial triage assessment does not replace that evaluation, and the importance of remaining in the ED until their evaluation is complete.  Report headache, congestion, bodyaches as well as vaginal discharge x 4 days.  Concerns for STI.  No runny nose, sneezing or coughing.   Alicia Klinefelter, PA-C 12/20/20 1606    02/19/21, MD 12/20/20 [phone]

## 2020-12-21 LAB — GC/CHLAMYDIA PROBE AMP (~~LOC~~) NOT AT ARMC
Chlamydia: NEGATIVE
Comment: NEGATIVE
Comment: NORMAL
Neisseria Gonorrhea: NEGATIVE

## 2020-12-21 LAB — RPR: RPR Ser Ql: NONREACTIVE

## 2023-10-27 ENCOUNTER — Encounter (HOSPITAL_COMMUNITY): Payer: Self-pay | Admitting: Emergency Medicine

## 2023-10-27 ENCOUNTER — Other Ambulatory Visit: Payer: Self-pay

## 2023-10-27 ENCOUNTER — Emergency Department (HOSPITAL_COMMUNITY)
Admission: EM | Admit: 2023-10-27 | Discharge: 2023-10-27 | Disposition: A | Discharge status: Home / Self Care | Attending: Emergency Medicine | Admitting: Emergency Medicine

## 2023-10-27 DIAGNOSIS — D171 Benign lipomatous neoplasm of skin and subcutaneous tissue of trunk: Secondary | ICD-10-CM | POA: Insufficient documentation

## 2023-10-27 DIAGNOSIS — R110 Nausea: Secondary | ICD-10-CM | POA: Insufficient documentation

## 2023-10-27 MED ORDER — ONDANSETRON 4 MG PO TBDP: 4.0000 mg | ORAL_TABLET | Freq: Three times a day (TID) | ORAL | 0 refills | Status: DC | PRN

## 2023-10-27 MED ORDER — FAMOTIDINE 20 MG PO TABS: 20.0000 mg | ORAL_TABLET | Freq: Two times a day (BID) | ORAL | 0 refills | Status: DC

## 2023-10-27 NOTE — Discharge Instructions (Signed)
 Please follow up with primary care. Return if the area becomes very painful, red, or you notice the area is draining.   For your intermittent nausea you can take Pepcid for the next 2-4 weeks. You can take Zofran as needed for nausea or vomiting.

## 2023-10-27 NOTE — ED Triage Notes (Signed)
 Patient comes in for possible cyst at right lower underwear line that has there and gotten larger since November. Patient complains of nausea when trying to drink water.

## 2023-10-27 NOTE — ED Provider Notes (Signed)
 [place] EMERGENCY DEPARTMENT AT [facility] Provider Note   CSN: [id number] Arrival date & time: 10/27/23  1126     History  Chief Complaint  Patient presents with   Abscess   Abdominal Pain    Alicia Gutierrez is a 33 y.o. female.  Without significant past medical history reporting to the emergency room with complaint of cyst in lower abdomen.  Patient reports this has been present since November and sometimes becomes more apparent.  She reports she has had some recent weight loss, she has been trying to lose weight, in which she has noticed the area more.  She notes some mild discomfort sometimes when sitting.  Denies any drainage from the area or rash.  She has tried Tylenol with symptom control.   Patient also reports that in the past few months she has had some intermittent nausea associated with eating and drinking.  She reports that 3 weeks ago she had a sinus infection and she felt her nausea associated with getting worse, symptoms have now improved, but she wanted to discuss since she was here for cyst. She denies any recent antibiotic use or abdominal pain.  She is tolerating oral intake.  She has not tried anything for the symptoms.   Abscess Associated symptoms: nausea   Abdominal Pain Associated symptoms: nausea        Home Medications Prior to Admission medications   Medication Sig Start Date End Date Taking? Authorizing Provider  Diphenhydramine-PE-APAP [facility] SEVERE COLD/CGH NIGHT) 25-10-650 MG PACK Take 1 Package by mouth at bedtime as needed (headache).    [provider]  DM-Phenylephrine-Acetaminophen 10-5-325 MG/15ML LIQD Take 30 mLs by mouth daily as needed (headache).     [provider]  ibuprofen (ADVIL,MOTRIN) 800 MG tablet Take 1 tablet (800 mg total) by mouth 3 (three) times daily. Patient not taking: Reported on 03/11/2016 03/20/13   Santiago Glad, PA-C  methocarbamol (ROBAXIN) 500 MG tablet Take 1 tablet (500 mg  total) by mouth 2 (two) times daily. 03/20/13   Santiago Glad, PA-C  naproxen sodium (ANAPROX) 220 MG tablet Take 440 mg by mouth daily as needed (headache).    [provider]  ondansetron (ZOFRAN-ODT) 8 MG disintegrating tablet Take 1 tablet (8 mg total) by mouth every 8 (eight) hours as needed for nausea or vomiting. 03/11/16   Benjiman Core, MD      Allergies    Patient has no known allergies.    Review of Systems   Review of Systems  Gastrointestinal:  Positive for nausea.  Skin:  Positive for wound.    Physical Exam Updated Vital Signs BP (!) 132/112 (BP Location: Left Arm)   Pulse 88   Temp 99.8 F (37.7 C) (Oral)   Resp 18   Ht 5\' 5"  (1.651 m)   Wt 102.1 kg   LMP 10/09/2023   SpO2 99%   BMI 37.44 kg/m  Physical Exam Vitals and nursing note reviewed.  Constitutional:      General: She is not in acute distress.    Appearance: She is not toxic-appearing.  HENT:     Head: Normocephalic and atraumatic.  Eyes:     General: No scleral icterus.    Conjunctiva/sclera: Conjunctivae normal.  Cardiovascular:     Rate and Rhythm: Normal rate and regular rhythm.     Pulses: Normal pulses.     Heart sounds: Normal heart sounds.  Pulmonary:     Effort: Pulmonary effort is normal. No respiratory distress.  Breath sounds: Normal breath sounds.  Abdominal:     General: Abdomen is flat. Bowel sounds are normal.     Palpations: Abdomen is soft.     Tenderness: There is no abdominal tenderness.  Musculoskeletal:     Right lower leg: No edema.     Left lower leg: No edema.  Skin:    General: Skin is warm and dry.     Findings: No lesion.     Comments: Soft mobile mass consistent with lipoma.   Neurological:     General: No focal deficit present.     Mental Status: She is alert and oriented to person, place, and time. Mental status is at baseline.     ED Results / Procedures / Treatments   Labs (all labs ordered are listed, but only abnormal results are  displayed) Labs Reviewed - No data to display  EKG None  Radiology No results found.  Procedures Procedures    Medications Ordered in ED Medications - No data to display  ED Course/ Medical Decision Making/ A&P                                 Medical Decision Making Risk Prescription drug management.   This patient presents to the ED for concern of cyst, this involves an extensive number of treatment options, and is a complaint that carries with it a high risk of complications and morbidity.  The differential diagnosis includes, lipoma, sebaceous cyst, cellulitis, lymphadenopathy  Cardiac Monitoring: / EKG:  The patient was maintained on a cardiac monitor.    Problem List / ED Course / Critical interventions / Medication management  Patient is complaining some intermittent nausea when eating or drinking. She has not had any vomiting.  She is tolerating oral intake.  This has been going on for some time and has started to improve over the past 3 weeks since she got over a sinus infection.  Did offer to consider labs however patient declined.  Feels may have GERD component and I will start her on Pepcid and Zofran as needed nausea vomiting.  Patient with no real abdominal pain, no focal abdominal tenderness. I discussed follow up with PCP to ensure treatment started to day in ED helps. Patient has had a knot to the right lower abdomen above the inguinal area.  She reports this has been present for 6 months.  Sometimes will cause some mild discomfort but overall is not painful.  Has not noted any redness or swelling.  No fevers or chills.  On exam it is a soft mobile mass consistent with lipoma.  She has no discomfort over the area of this.  Suspect this is abscess and there is no area of fluctuance.  She will follow-up with primary care for recheck.  Given return precautions of it becomes swollen painful or concerning for infection.  She is overall stable and assessment.  This has  been present for over 6 months do not feel any imaging or labs are needed to further evaluate, but discussed the importance of f/u.  I have reviewed the patients home medicines and have made adjustments as needed   Plan  F/u w/ PCP in 2-3d to ensure resolution of sx.  Patient was given return precautions. Patient stable for discharge at this time.  Patient educated on sx/dx and verbalized understanding of plan. Return to ER w/ new or worsening sx.  Final Clinical Impression(s) / ED Diagnoses Final diagnoses:  Lipoma of torso  Nausea    Rx / DC Orders ED Discharge Orders     None         Reinaldo Raddle 10/27/23 1325    Lorre Nick, MD 10/29/23 1558

## 2023-10-31 ENCOUNTER — Other Ambulatory Visit: Payer: Self-pay

## 2023-10-31 ENCOUNTER — Emergency Department (HOSPITAL_COMMUNITY)
Admission: EM | Admit: 2023-10-31 | Discharge: 2023-10-31 | Disposition: A | Discharge status: Home / Self Care | Attending: Emergency Medicine | Admitting: Emergency Medicine

## 2023-10-31 DIAGNOSIS — L03115 Cellulitis of right lower limb: Secondary | ICD-10-CM | POA: Diagnosis not present

## 2023-10-31 DIAGNOSIS — M7989 Other specified soft tissue disorders: Secondary | ICD-10-CM | POA: Diagnosis present

## 2023-10-31 MED ORDER — CEPHALEXIN 500 MG PO CAPS
500.0000 mg | ORAL_CAPSULE | Freq: Once | ORAL | Status: AC
  Administered 2023-10-31: 500 mg via ORAL
  Filled 2023-10-31: qty 1

## 2023-10-31 MED ORDER — CEPHALEXIN 500 MG PO CAPS: 500.0000 mg | ORAL_CAPSULE | Freq: Two times a day (BID) | ORAL | 0 refills | Status: DC

## 2023-10-31 NOTE — ED Triage Notes (Signed)
 Patient to ED by POV for abscess. States she has area for a year was seen earlier this week for area but states area has worsened, She voices swelling to area, drainage and painful. Denies fever chills N/V.

## 2023-10-31 NOTE — Discharge Instructions (Addendum)
 Today you are seen for cellulitis.  Please pick up your antibiotic and take as prescribed.  Please return to the ED if you have uncontrollable fever, increased pain, or uncontrollable nausea and vomiting.  Thank you for letting us  treat you today. After performing a physical exam and reviewing your imaging, I feel you are safe to go home. Please follow up with your PCP in the next several days and provide them with your records from this visit. Return to the Emergency Room if pain becomes severe or symptoms worsen.

## 2023-10-31 NOTE — ED Provider Notes (Cosign Needed Addendum)
 [place] EMERGENCY DEPARTMENT AT [facility] Provider Note   CSN: [id number] Arrival date & time: 10/31/23  1046     History  Chief Complaint  Patient presents with   Abscess    Right Pelvic area    Alicia Gutierrez is a 33 y.o. female presents today for right groin redness and swelling.  Patient states that she was seen earlier this week for same and at that time they felt it could be a lipoma.  Patient return as the area became red and more swollen with pain.  Patient denies fever, chills, nausea, or vomiting.  Patient denies drainage at this time.   Abscess      Home Medications Prior to Admission medications   Medication Sig Start Date End Date Taking? Authorizing Provider  cephALEXin (KEFLEX) 500 MG capsule Take 1 capsule (500 mg total) by mouth 2 (two) times daily. 10/31/23  Yes Bader Stubblefield N, PA-C  Diphenhydramine-PE-APAP (THERAFLU SEVERE COLD/CGH NIGHT) 25-10-650 MG PACK Take 1 Package by mouth at bedtime as needed (headache).    [provider]  DM-Phenylephrine-Acetaminophen 10-5-325 MG/15ML LIQD Take 30 mLs by mouth daily as needed (headache).     [provider]  famotidine (PEPCID) 20 MG tablet Take 1 tablet (20 mg total) by mouth 2 (two) times daily. 10/27/23   Barrett, Jamie N, PA-C  ibuprofen (ADVIL,MOTRIN) 800 MG tablet Take 1 tablet (800 mg total) by mouth 3 (three) times daily. Patient not taking: Reported on 03/11/2016 03/20/13   Letcher Rattler, PA-C  methocarbamol (ROBAXIN) 500 MG tablet Take 1 tablet (500 mg total) by mouth 2 (two) times daily. 03/20/13   Letcher Rattler, PA-C  naproxen sodium (ANAPROX) 220 MG tablet Take 440 mg by mouth daily as needed (headache).    [provider]  ondansetron (ZOFRAN-ODT) 4 MG disintegrating tablet Take 1 tablet (4 mg total) by mouth every 8 (eight) hours as needed for nausea or vomiting. 10/27/23   Barrett, Jamie N, PA-C      Allergies    Patient has no known allergies.    Review  of Systems   Review of Systems  Skin:  Positive for color change and wound.    Physical Exam Updated Vital Signs BP (!) 156/68 (BP Location: Left Arm)   Temp 98.3 F (36.8 C) (Oral)   Resp 16   Ht 5\' 5"  (1.651 m)   Wt 99.8 kg   LMP 10/09/2023   SpO2 100%   BMI 36.61 kg/m  Physical Exam Vitals and nursing note reviewed.  Constitutional:      General: She is not in acute distress.    Appearance: Normal appearance. She is well-developed. She is not ill-appearing, toxic-appearing or diaphoretic.  HENT:     Head: Normocephalic and atraumatic.  Eyes:     Conjunctiva/sclera: Conjunctivae normal.  Cardiovascular:     Rate and Rhythm: Normal rate and regular rhythm.     Pulses: Normal pulses.  Pulmonary:     Effort: Pulmonary effort is normal. No respiratory distress.  Abdominal:     Palpations: Abdomen is soft.  Musculoskeletal:        General: No swelling.     Cervical back: Neck supple.  Skin:    General: Skin is warm and dry.     Capillary Refill: Capillary refill takes less than 2 seconds.     Comments: Patient with moderate area of erythema and swelling to the upper right thigh/groin area.  Warmth also noted.  Area without  obvious fluctuance.  Neurological:     General: No focal deficit present.     Mental Status: She is alert.  Psychiatric:        Mood and Affect: Mood normal.     ED Results / Procedures / Treatments   Labs (all labs ordered are listed, but only abnormal results are displayed) Labs Reviewed - No data to display  EKG None  Radiology No results found.  Procedures .Ultrasound ED Soft Tissue  Date/Time: 10/31/2023 2:17 PM  Performed by: Carie Charity, PA-C Authorized by: Carie Charity, PA-C   Procedure details:    Indications: localization of abscess and evaluate for cellulitis     Transverse view:  Visualized   Images: archived     Limitations:  Positioning Location:    Location: lower extremity     Side:  Right Findings:      cellulitis present     Medications Ordered in ED Medications  cephALEXin (KEFLEX) capsule 500 mg (has no administration in time range)    ED Course/ Medical Decision Making/ A&P                                 Medical Decision Making Risk Prescription drug management.   This patient presents to the ED for concern of skin infection differential diagnosis includes abscess, lipoma, cellulitis, necrotizing fasciitis   Medicines ordered and prescription drug management:  I ordered medication including Keflex for cellulitis Reevaluation of the patient after these medicines showed that the patient stayed the same I have reviewed the patients home medicines and have made adjustments as needed  Consider for mission further workup however patient's vital signs, physical exam, and imaging were reassuring.  Patient had enlarged lymph node on ultrasound, but no abscess identified.  Patient's ultrasound was consistent with cellulitis.  Patient started on outpatient course of Keflex for cellulitis.  Patient given strict return precautions.        Final Clinical Impression(s) / ED Diagnoses Final diagnoses:  Cellulitis of right lower extremity    Rx / DC Orders ED Discharge Orders          Ordered    cephALEXin (KEFLEX) 500 MG capsule  2 times daily        10/31/23 1413              Carie Charity, PA-C 10/31/23 1419    Carie Charity, PA-C 10/31/23 1422    Tegeler, Marine Sia, MD 11/01/23 [phone]

## 2024-03-01 ENCOUNTER — Ambulatory Visit
Admission: RE | Admit: 2024-03-01 | Discharge: 2024-03-01 | Disposition: A | Discharge status: Home / Self Care | Attending: Family Medicine | Admitting: Family Medicine

## 2024-03-01 VITALS — BP 119/72 | HR 102 | Temp 102.8°F | Resp 20

## 2024-03-01 DIAGNOSIS — U071 COVID-19: Secondary | ICD-10-CM

## 2024-03-01 DIAGNOSIS — R509 Fever, unspecified: Secondary | ICD-10-CM | POA: Diagnosis not present

## 2024-03-01 LAB — POC COVID19/FLU A&B COMBO
Covid Antigen, POC: POSITIVE — AB
Influenza A Antigen, POC: NEGATIVE
Influenza B Antigen, POC: NEGATIVE

## 2024-03-01 MED ORDER — ACETAMINOPHEN 325 MG PO TABS
650.0000 mg | ORAL_TABLET | Freq: Once | ORAL | Status: AC
  Administered 2024-03-01 (×2): 650 mg via ORAL

## 2024-03-01 MED ORDER — IBUPROFEN 800 MG PO TABS
800.0000 mg | ORAL_TABLET | Freq: Once | ORAL | Status: AC
  Administered 2024-03-01 (×2): 800 mg via ORAL

## 2024-03-01 MED ORDER — PROMETHAZINE-DM 6.25-15 MG/5ML PO SYRP: 5.0000 mL | ORAL_SOLUTION | Freq: Three times a day (TID) | ORAL | 0 refills | Status: AC | PRN

## 2024-03-01 MED ORDER — IBUPROFEN 800 MG PO TABS: 800.0000 mg | ORAL_TABLET | Freq: Three times a day (TID) | ORAL | 0 refills | Status: DC

## 2024-03-01 MED ORDER — CETIRIZINE HCL 10 MG PO TABS: 10.0000 mg | ORAL_TABLET | Freq: Every day | ORAL | 0 refills | Status: AC

## 2024-03-01 MED ORDER — PSEUDOEPHEDRINE HCL 30 MG PO TABS: 30.0000 mg | ORAL_TABLET | Freq: Three times a day (TID) | ORAL | 0 refills | Status: AC | PRN

## 2024-03-01 NOTE — ED Triage Notes (Signed)
 Pt c/o fever, body aches, HA, nasal congestion, prod cough sx started 8/10-no relief with theraflu-last dose ibuprofen  yesterday-NAD-steady gait

## 2024-03-01 NOTE — ED Provider Notes (Signed)
 [facility] - URGENT [facility]ARE [facility]  Note:  This document was prepared using Conservation officer, historic buildings and may include unintentional dictation errors.  MRN: [id number] DOB: 06/30/1991  Subjective:   Alicia Gutierrez is a 33 y.o. female presenting for 2 to 3-day history of severe malaise, body pains, fevers, headaches, congestion, productive cough. Smokes a few cigarettes on the weekends.  No chest pain, shortness of breath or wheezing.  No history of asthma.    No chronic medications.   No Known Allergies  Past Medical History:  Diagnosis Date   Obstetrical blood-clot embolism, with postpartum complication 2008   blood clot in pelvis after delivery     Past Surgical History:  Procedure Laterality Date   NO PAST SURGERIES      Family History  Problem Relation Age of Onset   Hypertension Mother    Hypertension Maternal Grandmother     Social History   Tobacco Use   Smoking status: Every Day    Current packs/day: 0.15    Types: Cigarettes   Smokeless tobacco: Never  Vaping Use   Vaping status: Never Used  Substance Use Topics   Alcohol use: Yes    Comment: occassionally   Drug use: Yes    Types: Marijuana    ROS   Objective:   Vitals: BP 119/72 (BP Location: Left Arm)   Pulse (!) 102   Temp (!) 102.8 F (39.3 C) (Oral)   Resp 20   LMP 02/08/2024   SpO2 97%   Physical Exam Constitutional:      General: She is not in acute distress.    Appearance: Normal appearance. She is well-developed. She is not ill-appearing, toxic-appearing or diaphoretic.  HENT:     Head: Normocephalic and atraumatic.     Nose: Nose normal.     Mouth/Throat:     Mouth: Mucous membranes are moist.  Eyes:     General: No scleral icterus.       Right eye: No discharge.        Left eye: No discharge.     Extraocular Movements: Extraocular movements intact.  Cardiovascular:     Rate and Rhythm: Normal rate and regular rhythm.     Heart sounds: Normal heart sounds. No  murmur heard.    No friction rub. No gallop.  Pulmonary:     Effort: Pulmonary effort is normal. No respiratory distress.     Breath sounds: No stridor. No wheezing, rhonchi or rales.  Chest:     Chest wall: No tenderness.  Skin:    General: Skin is warm and dry.  Neurological:     General: No focal deficit present.     Mental Status: She is alert and oriented to person, place, and time.  Psychiatric:        Mood and Affect: Mood normal.        Behavior: Behavior normal.     Results for orders placed or performed during the hospital encounter of 03/01/24 (from the past 24 hours)  POC Covid19/Flu A&B Antigen     Status: Abnormal   Collection Time: 03/01/24 12:20 PM  Result Value Ref Range   Influenza A Antigen, POC Negative Negative   Influenza B Antigen, POC Negative Negative   Covid Antigen, POC Positive (A) Negative    Assessment and Plan :   PDMP not reviewed this encounter.  1. COVID-19 virus infection   2. Fever, unspecified    Will defer imaging given clear pulmonary exam.  Recommend supportive care.  Provided with documentation for work at her request.  Counseled patient on potential for adverse effects with medications prescribed/recommended today, ER and return-to-clinic precautions discussed, patient verbalized understanding.    Christopher Savannah, PA-C 03/01/24 1240

## 2024-03-01 NOTE — Discharge Instructions (Addendum)
 You tested positive for COVID 19. For sore throat or cough try using a honey-based tea. Use 3 teaspoons of honey with juice squeezed from half lemon. Place shaved pieces of ginger into 1/2-1 cup of water  and warm over stove top. Then mix the ingredients and repeat every 4 hours as needed. Please take ibuprofen  800mg  every 8 hours with food alternating with OR taken together with Tylenol  500mg -650mg  every 6 hours for throat pain, fevers, aches and pains. Hydrate very well with at least 2 liters of water . Eat light meals such as soups (chicken and noodles, vegetable, chicken and wild rice).  Do not eat foods that you are allergic to.  Taking an antihistamine like Zyrtec  (10mg  daily) can help against postnasal drainage, sinus congestion which can cause sinus pain, sinus headaches, throat pain, painful swallowing, coughing.  You can take this together with pseudoephedrine  (Sudafed) at a dose of 30mg  3 times a day or twice daily as needed for the same kind of nasal drip, congestion.  Use cough syrup as needed.

## 2024-05-23 ENCOUNTER — Telehealth: Admitting: Emergency Medicine

## 2024-05-23 DIAGNOSIS — S99912A Unspecified injury of left ankle, initial encounter: Secondary | ICD-10-CM | POA: Diagnosis not present

## 2024-05-23 DIAGNOSIS — S8991XA Unspecified injury of right lower leg, initial encounter: Secondary | ICD-10-CM

## 2024-05-23 DIAGNOSIS — W19XXXA Unspecified fall, initial encounter: Secondary | ICD-10-CM

## 2024-05-23 MED ORDER — IBUPROFEN 800 MG PO TABS: 800.0000 mg | ORAL_TABLET | Freq: Three times a day (TID) | ORAL | 0 refills | Status: AC
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
## 2024-05-23 NOTE — Patient Instructions (Addendum)
  Alicia Gutierrez, thank you for joining Jon CHRISTELLA Belt, NP for today's virtual visit.  While this provider is not your primary care provider (PCP), if your PCP is located in our provider database this encounter information will be shared with them immediately following your visit.   A [facility] MyChart account gives you access to today's visit and all your visits, tests, and labs performed at [facility]  click here if you don't have a [facility] MyChart account or go to [email or web address]  Consent: (Patient) Alicia Gutierrez provided verbal consent for this virtual visit at the beginning of the encounter.  Current Medications:  Current Outpatient Medications:    cetirizine  (ZYRTEC  ALLERGY) 10 MG tablet, Take 1 tablet (10 mg total) by mouth daily., Disp: 30 tablet, Rfl: 0   ibuprofen  (ADVIL ) 800 MG tablet, Take 1 tablet (800 mg total) by mouth 3 (three) times daily., Disp: 21 tablet, Rfl: 0   promethazine -dextromethorphan (PROMETHAZINE -DM) 6.25-15 MG/5ML syrup, Take 5 mLs by mouth 3 (three) times daily as needed for cough., Disp: 200 mL, Rfl: 0   pseudoephedrine  (SUDAFED) 30 MG tablet, Take 1 tablet (30 mg total) by mouth every 8 (eight) hours as needed for congestion., Disp: 30 tablet, Rfl: 0   Medications ordered in this encounter:  Meds ordered this encounter  Medications   ibuprofen  (ADVIL ) 800 MG tablet    Sig: Take 1 tablet (800 mg total) by mouth 3 (three) times daily.    Dispense:  21 tablet    Refill:  0     *If you need refills on other medications prior to your next appointment, please contact your pharmacy*  Follow-Up: Call back or seek an in-person evaluation if the symptoms worsen or if the condition fails to improve as anticipated.  [facility] [phone]  Other Instructions Soak in a warm bath with epsom salt in it twice today. Use a lot of epsom salt in the bath. If you have any scrapes/cuts, cover them with vaseline first or  the epsom salt might sting  Take the ibuprofen  and try the epsom salt soaks today. If you are not feeling better by tomorrow, you will need to get checked in person. Try a sports medicine clinic or the orthopedic urgent care.    [facility] [address] [facility] [phone]  [facility] [address] [place] [phone]  [facility] [place] [address] [place]  [phone]  Or  [facility] [address] [place] [phone]   If you have been instructed to have an in-person evaluation today at a local Urgent Care facility, please use the link below. It will take you to a list of all of our available [facility], including address, phone number and hours of operation. Please do not delay care.  [facility]  If you or a family member do not have a primary care provider, use the link below to schedule a visit and establish care. When you choose a [facility] primary care physician or advanced practice provider, you gain a long-term partner in health. Find a Primary Care Provider  Learn more about [facility] in-office and virtual care options: [facility] Get Care Now
